# Patient Record
Sex: Male | Born: 2000 | Race: White | Hispanic: No | Marital: Single | Smoking: Never smoker
Health system: Southern US, Community
[De-identification: ages and names within clinical notes are randomized; demographics above are authoritative.]

## PROBLEM LIST (undated history)

## (undated) DIAGNOSIS — J45909 Unspecified asthma, uncomplicated: Secondary | ICD-10-CM

## (undated) DIAGNOSIS — T7840XA Allergy, unspecified, initial encounter: Secondary | ICD-10-CM

## (undated) HISTORY — DX: Unspecified asthma, uncomplicated: J45.909

## (undated) HISTORY — DX: Allergy, unspecified, initial encounter: T78.40XA

## (undated) HISTORY — PX: APPENDECTOMY: SHX54

---
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
## 2020-10-14 NOTE — Progress Notes (Deleted)
     Established patient visit   Patient: Danny Walsh   DOB: [birth year]   19 y.o. Male  MRN: [id number] Visit Date: 10/15/2020  Today's healthcare provider: Margaretann Loveless, PA-C   No chief complaint on file.  Subjective    HPI  ***  {Show patient history (optional):23778::" "}   Medications: Outpatient Medications Prior to Visit  Medication Sig  . escitalopram (LEXAPRO) 10 MG tablet Take 1 tablet (10 mg total) by mouth daily.   No facility-administered medications prior to visit.    Review of Systems  {Heme  Chem  Endocrine  Serology  Results Review (optional):23779::" "}  Objective    There were no vitals taken for this visit. {Show previous vital signs (optional):23777::" "}  Physical Exam  ***  No results found for any visits on 10/15/20.  Assessment & Plan     ***  No follow-ups on file.      {provider attestation***:1}   Reine Just  [facility] [phone] (phone) [phone] (fax)  [facility]

## 2020-10-15 ENCOUNTER — Other Ambulatory Visit: Payer: Self-pay

## 2020-10-15 ENCOUNTER — Ambulatory Visit: Payer: Medicaid Other | Admitting: Physician Assistant

## 2021-06-29 ENCOUNTER — Other Ambulatory Visit: Payer: Self-pay

## 2021-06-29 ENCOUNTER — Emergency Department (HOSPITAL_COMMUNITY): Payer: No Typology Code available for payment source

## 2021-06-29 ENCOUNTER — Inpatient Hospital Stay (HOSPITAL_COMMUNITY)
Admission: EM | Admit: 2021-06-29 | Discharge: 2021-07-02 | DRG: 964 | Disposition: A | Discharge status: Home / Self Care | Payer: No Typology Code available for payment source | Attending: General Surgery | Admitting: General Surgery

## 2021-06-29 ENCOUNTER — Encounter (HOSPITAL_COMMUNITY): Payer: Self-pay | Admitting: *Deleted

## 2021-06-29 DIAGNOSIS — S270XXA Traumatic pneumothorax, initial encounter: Secondary | ICD-10-CM | POA: Diagnosis not present

## 2021-06-29 DIAGNOSIS — Z20822 Contact with and (suspected) exposure to covid-19: Secondary | ICD-10-CM | POA: Diagnosis present

## 2021-06-29 DIAGNOSIS — Y92488 Other paved roadways as the place of occurrence of the external cause: Secondary | ICD-10-CM

## 2021-06-29 DIAGNOSIS — D62 Acute posthemorrhagic anemia: Secondary | ICD-10-CM | POA: Diagnosis present

## 2021-06-29 DIAGNOSIS — R079 Chest pain, unspecified: Secondary | ICD-10-CM | POA: Diagnosis not present

## 2021-06-29 DIAGNOSIS — J45909 Unspecified asthma, uncomplicated: Secondary | ICD-10-CM | POA: Diagnosis present

## 2021-06-29 DIAGNOSIS — S42001A Fracture of unspecified part of right clavicle, initial encounter for closed fracture: Secondary | ICD-10-CM | POA: Diagnosis present

## 2021-06-29 DIAGNOSIS — S36115A Moderate laceration of liver, initial encounter: Secondary | ICD-10-CM | POA: Diagnosis present

## 2021-06-29 DIAGNOSIS — R21 Rash and other nonspecific skin eruption: Secondary | ICD-10-CM | POA: Diagnosis present

## 2021-06-29 DIAGNOSIS — S2241XA Multiple fractures of ribs, right side, initial encounter for closed fracture: Secondary | ICD-10-CM | POA: Diagnosis present

## 2021-06-29 DIAGNOSIS — T1490XA Injury, unspecified, initial encounter: Secondary | ICD-10-CM

## 2021-06-29 DIAGNOSIS — J939 Pneumothorax, unspecified: Secondary | ICD-10-CM

## 2021-06-29 HISTORY — DX: Unspecified asthma, uncomplicated: J45.909

## 2021-06-29 LAB — I-STAT CHEM 8, ED
BUN: 9 mg/dL (ref 6–20)
Calcium, Ion: 1.14 mmol/L — ABNORMAL LOW (ref 1.15–1.40)
Chloride: 102 mmol/L (ref 98–111)
Creatinine, Ser: 1.1 mg/dL (ref 0.61–1.24)
Glucose, Bld: 186 mg/dL — ABNORMAL HIGH (ref 70–99)
HCT: 44 % (ref 39.0–52.0)
Hemoglobin: 15 g/dL (ref 13.0–17.0)
Potassium: 3.1 mmol/L — ABNORMAL LOW (ref 3.5–5.1)
Sodium: 141 mmol/L (ref 135–145)
TCO2: 25 mmol/L (ref 22–32)

## 2021-06-29 LAB — RESP PANEL BY RT-PCR (FLU A&B, COVID) ARPGX2
Influenza A by PCR: NEGATIVE
Influenza B by PCR: NEGATIVE
SARS Coronavirus 2 by RT PCR: NEGATIVE

## 2021-06-29 LAB — SAMPLE TO BLOOD BANK

## 2021-06-29 LAB — CBC
HCT: 43.9 % (ref 39.0–52.0)
Hemoglobin: 14.7 g/dL (ref 13.0–17.0)
MCH: 29.4 pg (ref 26.0–34.0)
MCHC: 33.5 g/dL (ref 30.0–36.0)
MCV: 87.8 fL (ref 80.0–100.0)
Platelets: 279 10*3/uL (ref 150–400)
RBC: 5 MIL/uL (ref 4.22–5.81)
RDW: 12 % (ref 11.5–15.5)
WBC: 15.4 10*3/uL — ABNORMAL HIGH (ref 4.0–10.5)
nRBC: 0 % (ref 0.0–0.2)

## 2021-06-29 LAB — PROTIME-INR
INR: 1.1 (ref 0.8–1.2)
Prothrombin Time: 14.5 seconds (ref 11.4–15.2)

## 2021-06-29 LAB — ETHANOL: Alcohol, Ethyl (B): <10 mg/dL (ref <10)

## 2021-06-29 LAB — LACTIC ACID, PLASMA: Lactic Acid, Venous: 2.8 mmol/L (ref 0.5–1.9)

## 2021-06-29 LAB — COMPREHENSIVE METABOLIC PANEL
ALT: 420 U/L — ABNORMAL HIGH (ref 0–44)
AST: 584 U/L — ABNORMAL HIGH (ref 15–41)
Albumin: 4.7 g/dL (ref 3.5–5.0)
Alkaline Phosphatase: 64 U/L (ref 38–126)
Anion gap: 12 (ref 5–15)
BUN: 10 mg/dL (ref 6–20)
CO2: 23 mmol/L (ref 22–32)
Calcium: 9.8 mg/dL (ref 8.9–10.3)
Chloride: 103 mmol/L (ref 98–111)
Creatinine, Ser: 1.29 mg/dL — ABNORMAL HIGH (ref 0.61–1.24)
GFR, Estimated: >60 mL/min (ref >60)
Glucose, Bld: 185 mg/dL — ABNORMAL HIGH (ref 70–99)
Potassium: 3.1 mmol/L — ABNORMAL LOW (ref 3.5–5.1)
Sodium: 138 mmol/L (ref 135–145)
Total Bilirubin: 0.7 mg/dL (ref 0.3–1.2)
Total Protein: 7.2 g/dL (ref 6.5–8.1)

## 2021-06-29 MED ORDER — FENTANYL CITRATE (PF) 100 MCG/2ML IJ SOLN
INTRAMUSCULAR | Status: AC
  Administered 2021-06-29: 100 ug
  Filled 2021-06-29: qty 2

## 2021-06-29 MED ORDER — HYDROMORPHONE HCL 1 MG/ML IJ SOLN
0.5000 mg | INTRAMUSCULAR | Status: DC | PRN
Start: 2021-06-29 — End: 2021-07-02

## 2021-06-29 MED ORDER — FENTANYL CITRATE PF 50 MCG/ML IJ SOSY
100.0000 ug | PREFILLED_SYRINGE | INTRAMUSCULAR | Status: DC | PRN
Start: 2021-06-29 — End: 2021-06-30
  Administered 2021-06-29 (×2): 100 ug via INTRAVENOUS

## 2021-06-29 MED ORDER — ONDANSETRON 4 MG PO TBDP: 4.0000 mg | ORAL_TABLET | Freq: Four times a day (QID) | ORAL | Status: DC | PRN

## 2021-06-29 MED ORDER — ONDANSETRON HCL 4 MG/2ML IJ SOLN
4.0000 mg | Freq: Four times a day (QID) | INTRAMUSCULAR | Status: DC | PRN
  Administered 2021-06-30: 4 mg via INTRAVENOUS
  Filled 2021-06-29: qty 2

## 2021-06-29 MED ORDER — ENOXAPARIN SODIUM 30 MG/0.3ML IJ SOSY
30.0000 mg | PREFILLED_SYRINGE | Freq: Two times a day (BID) | INTRAMUSCULAR | Status: DC
  Administered 2021-07-01 – 2021-07-02 (×4): 30 mg via SUBCUTANEOUS
  Filled 2021-06-29 (×3): qty 0.3

## 2021-06-29 MED ORDER — ONDANSETRON HCL 4 MG/2ML IJ SOLN
INTRAMUSCULAR | Status: AC
  Administered 2021-06-29: 4 mg
  Filled 2021-06-29: qty 2

## 2021-06-29 MED ORDER — METHOCARBAMOL 1000 MG/10ML IJ SOLN
1000.0000 mg | Freq: Three times a day (TID) | INTRAMUSCULAR | Status: DC
Start: 2021-06-29 — End: 2021-07-01
  Administered 2021-06-29 – 2021-07-01 (×5): 1000 mg via INTRAVENOUS
  Filled 2021-06-29 (×5): qty 10

## 2021-06-29 MED ORDER — OXYCODONE HCL 5 MG PO TABS
10.0000 mg | ORAL_TABLET | ORAL | Status: DC | PRN
Start: 2021-06-29 — End: 2021-07-02
  Administered 2021-06-30 – 2021-07-01 (×2): 10 mg via ORAL
  Filled 2021-06-29 (×3): qty 2

## 2021-06-29 MED ORDER — OXYCODONE HCL 5 MG PO TABS: 5.0000 mg | ORAL_TABLET | ORAL | Status: DC | PRN

## 2021-06-29 MED ORDER — LACTATED RINGERS IV SOLN: INTRAVENOUS | Status: DC

## 2021-06-29 MED ORDER — ACETAMINOPHEN 325 MG PO TABS: 650.0000 mg | ORAL_TABLET | ORAL | Status: DC | PRN

## 2021-06-29 MED ORDER — PROCHLORPERAZINE MALEATE 10 MG PO TABS
10.0000 mg | ORAL_TABLET | Freq: Four times a day (QID) | ORAL | Status: DC | PRN
  Filled 2021-06-29: qty 1

## 2021-06-29 MED ORDER — PROCHLORPERAZINE EDISYLATE 10 MG/2ML IJ SOLN: 5.0000 mg | Freq: Four times a day (QID) | INTRAMUSCULAR | Status: DC | PRN

## 2021-06-29 MED ORDER — SODIUM CHLORIDE 0.9 % IV SOLN
INTRAVENOUS | Status: AC | PRN
  Administered 2021-06-29: 1000 mL via INTRAVENOUS

## 2021-06-29 MED ORDER — IOHEXOL 350 MG/ML SOLN
80.0000 mL | Freq: Once | INTRAVENOUS | Status: AC | PRN
  Administered 2021-06-29: 80 mL via INTRAVENOUS
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
## 2021-06-29 NOTE — ED Notes (Signed)
Contacted lab in processing delay for covid; swab in process now

## 2021-06-29 NOTE — ED Notes (Signed)
Pt placed on 2LPM via  of oxygen for intermittent oxygen desaturation. Currently it was 89% on room air. 2LPM brought up SP02 to 100%

## 2021-06-29 NOTE — ED Notes (Signed)
Transition of Care [facility]) - CAGE-AID Screening  Patient Details  Name: Danny Walsh MRN: [id number] Date of Birth: 2001-04-14   Ranae Plumber, RN 06/29/2021, 10:21 PM   Clinical Narrative:  Pt denies drug or alcohol use  CAGE-AID Screening:    Have You Ever Felt You Ought to Cut Down on Your Drinking or Drug Use?: No Have People Annoyed You By [employer] Your Drinking Or Drug Use?: No Have You Felt Bad Or Guilty About Your Drinking Or Drug Use?: No Have You Ever Had a Drink or Used Drugs First Thing In The Morning to Steady Your Nerves or to Get Rid of a Hangover?: No CAGE-AID Score: 0  Substance Abuse Education Offered: No

## 2021-06-29 NOTE — ED Notes (Signed)
Pt taken to CT with TRN; delay in CT due to chest tube placement
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
## 2021-06-29 NOTE — ED Notes (Signed)
As per Dr. [name], pt can have clear liquids and EDP was going to page ortho.
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
## 2021-06-29 NOTE — ED Notes (Signed)
Emesis x2; zofran provided

## 2021-06-29 NOTE — ED Notes (Signed)
MD notified that pt was placed on 2LPM via [place] of oxygen for his desaturation episodes.

## 2021-06-29 NOTE — ED Notes (Signed)
MD at bedside for R sided chest tube
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
## 2021-06-30 ENCOUNTER — Inpatient Hospital Stay (HOSPITAL_COMMUNITY): Payer: No Typology Code available for payment source

## 2021-06-30 LAB — CBC
HCT: 37.6 % — ABNORMAL LOW (ref 39.0–52.0)
HCT: 36.4 % — ABNORMAL LOW (ref 39.0–52.0)
Hemoglobin: 12.7 g/dL — ABNORMAL LOW (ref 13.0–17.0)
Hemoglobin: 12.3 g/dL — ABNORMAL LOW (ref 13.0–17.0)
MCH: 29.5 pg (ref 26.0–34.0)
MCH: 29.4 pg (ref 26.0–34.0)
MCHC: 33.8 g/dL (ref 30.0–36.0)
MCHC: 33.8 g/dL (ref 30.0–36.0)
MCV: 87.2 fL (ref 80.0–100.0)
MCV: 87.1 fL (ref 80.0–100.0)
Platelets: 212 10*3/uL (ref 150–400)
Platelets: 205 10*3/uL (ref 150–400)
RBC: 4.31 MIL/uL (ref 4.22–5.81)
RBC: 4.18 MIL/uL — ABNORMAL LOW (ref 4.22–5.81)
RDW: 12 % (ref 11.5–15.5)
RDW: 12.3 % (ref 11.5–15.5)
WBC: 9.9 10*3/uL (ref 4.0–10.5)
WBC: 9.7 10*3/uL (ref 4.0–10.5)
nRBC: 0 % (ref 0.0–0.2)
nRBC: 0 % (ref 0.0–0.2)

## 2021-06-30 LAB — BASIC METABOLIC PANEL
Anion gap: 8 (ref 5–15)
BUN: 9 mg/dL (ref 6–20)
CO2: 23 mmol/L (ref 22–32)
Calcium: 8.8 mg/dL — ABNORMAL LOW (ref 8.9–10.3)
Chloride: 105 mmol/L (ref 98–111)
Creatinine, Ser: 1.08 mg/dL (ref 0.61–1.24)
GFR, Estimated: >60 mL/min (ref >60)
Glucose, Bld: 113 mg/dL — ABNORMAL HIGH (ref 70–99)
Potassium: 4.2 mmol/L (ref 3.5–5.1)
Sodium: 136 mmol/L (ref 135–145)

## 2021-06-30 LAB — HIV ANTIBODY (ROUTINE TESTING W REFLEX): HIV Screen 4th Generation wRfx: NONREACTIVE

## 2021-06-30 MED ORDER — FENTANYL CITRATE (PF) 100 MCG/2ML IJ SOLN
100.0000 ug | INTRAMUSCULAR | Status: DC | PRN
Start: 2021-06-30 — End: 2021-07-01

## 2021-06-30 NOTE — ED Notes (Signed)
Floor nurse is not taking report at this time

## 2021-06-30 NOTE — ED Notes (Signed)
Report given to [facility] RN at [phone]

## 2021-06-30 NOTE — ED Notes (Signed)
Attempted to call report at [phone], nurse states she needs 5 more minutes, will attempt again

## 2021-06-30 NOTE — ED Notes (Signed)
[place] Grandchild father [phone] would like to speak to the patient or get an update from the nurse
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
## 2021-06-30 NOTE — ED Notes (Signed)
Incentive spirometer given to patient and instructed on use. Patient verbalized understanding on use without issue.
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
## 2021-06-30 NOTE — Progress Notes (Signed)
Orthopedic Tech Progress Note Patient Details:  Danny Walsh 12-26-2000 [id number]  Ortho Devices Type of Ortho Device: Arm sling Ortho Device/Splint Location: rue Ortho Device/Splint Interventions: Ordered, Application, Adjustment   Post Interventions Patient Tolerated: Well Instructions Provided: Care of device, Adjustment of device  [name] 06/30/2021, 2:31 AM
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
## 2021-07-01 ENCOUNTER — Inpatient Hospital Stay (HOSPITAL_COMMUNITY): Payer: No Typology Code available for payment source

## 2021-07-01 LAB — CBC
HCT: 37.8 % — ABNORMAL LOW (ref 39.0–52.0)
Hemoglobin: 12.5 g/dL — ABNORMAL LOW (ref 13.0–17.0)
MCH: 29 pg (ref 26.0–34.0)
MCHC: 33.1 g/dL (ref 30.0–36.0)
MCV: 87.7 fL (ref 80.0–100.0)
Platelets: 184 10*3/uL (ref 150–400)
RBC: 4.31 MIL/uL (ref 4.22–5.81)
RDW: 12.2 % (ref 11.5–15.5)
WBC: 9.3 10*3/uL (ref 4.0–10.5)
nRBC: 0 % (ref 0.0–0.2)

## 2021-07-01 LAB — BASIC METABOLIC PANEL
Anion gap: 7 (ref 5–15)
BUN: 8 mg/dL (ref 6–20)
CO2: 27 mmol/L (ref 22–32)
Calcium: 9.2 mg/dL (ref 8.9–10.3)
Chloride: 102 mmol/L (ref 98–111)
Creatinine, Ser: 1.16 mg/dL (ref 0.61–1.24)
GFR, Estimated: >60 mL/min (ref >60)
Glucose, Bld: 96 mg/dL (ref 70–99)
Potassium: 4 mmol/L (ref 3.5–5.1)
Sodium: 136 mmol/L (ref 135–145)

## 2021-07-01 MED ORDER — DIPHENHYDRAMINE-ZINC ACETATE 2-0.1 % EX CREA
TOPICAL_CREAM | Freq: Three times a day (TID) | CUTANEOUS | Status: DC | PRN
  Filled 2021-07-01: qty 28

## 2021-07-01 MED ORDER — ACETAMINOPHEN 325 MG PO TABS: 650.0000 mg | ORAL_TABLET | Freq: Four times a day (QID) | ORAL | Status: DC

## 2021-07-01 MED ORDER — METHOCARBAMOL 500 MG PO TABS
1000.0000 mg | ORAL_TABLET | Freq: Three times a day (TID) | ORAL | Status: DC
  Administered 2021-07-01 – 2021-07-02 (×4): 1000 mg via ORAL
  Filled 2021-07-01 (×4): qty 2

## 2021-07-01 MED ORDER — ACETAMINOPHEN 500 MG PO TABS: 1000.0000 mg | ORAL_TABLET | Freq: Four times a day (QID) | ORAL | Status: DC

## 2021-07-01 MED ORDER — ACETAMINOPHEN 325 MG PO TABS
650.0000 mg | ORAL_TABLET | Freq: Four times a day (QID) | ORAL | Status: DC
  Administered 2021-07-01 – 2021-07-02 (×6): 650 mg via ORAL
  Filled 2021-07-01 (×5): qty 2

## 2021-07-01 NOTE — Progress Notes (Signed)
Patient arrived to 6N20 from ED. Report received from Ethelene Browns, [place]. Patient alert and oriented x4. R chest tube in place. 65ml of sanguinous output. Dressing clean, dry, and intact. Pt c/o 1/10 R flank pain due to activity. R arm in sling. See assessment. Pt c/o 0/10 shoulder pain at this moment. Pts brother and girlfriend at bedside.   No chest tube orders. Will page MD for chest tube orders due.  [id number] Adu, MD gave verbal order for -20cm continuous chest tube wall suction.

## 2021-07-01 NOTE — Progress Notes (Signed)
Pt has two new spots that are itchy on his right thigh. One is just above the inside of his knee, raised bump. The other is six little raised dots in a small rectangle about 2 inches below his groin. He is trying to refrain from itching and is afraid of them possibly spreading. Notified PA, she will come up to see him shortly.

## 2021-07-01 NOTE — Progress Notes (Signed)
Progress Note     Subjective: Patient reports some mild pain but overall well controlled. Denies SOB. Using IS and able to pull to 1000. No further n/v. Passing flatus.   Objective: Vital signs in last 24 hours: Temp:  [98.1 F (36.7 C)-99.2 F (37.3 C)] 99.2 F (37.3 C) (08/24 0736) Pulse Rate:  [58-86] 74 (08/24 0736) Resp:  [14-22] 18 (08/24 0736) BP: (100-126)/(54-96) 114/67 (08/24 0736) SpO2:  [95 %-98 %] 95 % (08/24 0736)    Intake/Output from previous day: 08/23 0701 - 08/24 0700 In: 1785.8 [P.O.:240; I.V.:1193.3; IV Piggyback:352.5] Out: 692 [Urine:600; Chest Tube:92] Intake/Output this shift: No intake/output data recorded.  PE: General: pleasant, WD, thin male who is laying in bed in NAD HEENT: head is normocephalic, atraumatic.  Sclera are noninjected.  PERRL.  Ears and nose without any masses or lesions.  Mouth is pink and moist Heart: regular, rate, and rhythm.  Normal s1,s2. No obvious murmurs, gallops, or rubs noted.  Palpable radial and pedal pulses bilaterally Lungs: CTAB, no wheezes, rhonchi, or rales noted.  Respiratory effort nonlabored; R CT in place with bloody drainage (110 cc out total), no air leak Abd: soft, NT, ND, +BS, no masses, hernias, or organomegaly MS: RUE in sling, R hand NVI; no deformity of LUE and ROM appears grossly intact; No deformity BLE and ROM appears grossly intact  Skin: warm and dry with no masses, lesions, or rashes Neuro: Cranial nerves 2-12 grossly intact, sensation is normal throughout Psych: A&Ox3 with an appropriate affect.    Lab Results:  Recent Labs    06/30/21 1152 07/01/21 0757  WBC 9.7 9.3  HGB 12.3* 12.5*  HCT 36.4* 37.8*  PLT 205 184   BMET Recent Labs    06/30/21 0550 07/01/21 0130  NA 136 136  K 4.2 4.0  CL 105 102  CO2 23 27  GLUCOSE 113* 96  BUN 9 8  CREATININE 1.08 1.16  CALCIUM 8.8* 9.2   PT/INR Recent Labs    06/29/21 2035  LABPROT 14.5  INR 1.1   CMP     Component Value  Date/Time   NA 136 07/01/2021 0130   K 4.0 07/01/2021 0130   CL 102 07/01/2021 0130   CO2 27 07/01/2021 0130   GLUCOSE 96 07/01/2021 0130   BUN 8 07/01/2021 0130   CREATININE 1.16 07/01/2021 0130   CALCIUM 9.2 07/01/2021 0130   PROT 7.2 06/29/2021 2035   ALBUMIN 4.7 06/29/2021 2035   AST 584 (H) 06/29/2021 2035   ALT 420 (H) 06/29/2021 2035   ALKPHOS 64 06/29/2021 2035   BILITOT 0.7 06/29/2021 2035   GFRNONAA >60 07/01/2021 0130   Lipase  No results found for: LIPASE     Studies/Results: DG Shoulder Right  Result Date: 06/29/2021 CLINICAL DATA:  Trauma and pain. EXAM: RIGHT SHOULDER - 2+ VIEW COMPARISON:  Chest CT dated 06/29/2021. FINDINGS: Interval placement of a right-sided chest tube with tip in the right suprahilar region. No pneumothorax identified. Nondisplaced fracture of the right clavicle. IMPRESSION: Interval placement of a right-sided chest tube. No pneumothorax identified. Electronically Signed   By: Elgie CollardArash  Radparvar M.D.   On: 06/29/2021 23:13   CT HEAD WO CONTRAST  Result Date: 06/29/2021 CLINICAL DATA:  Polytrauma, critical, head/C-spine injury suspected; Chest trauma, mod-severe; Abdominal trauma. EXAM: CT HEAD WITHOUT CONTRAST CT CERVICAL SPINE WITHOUT CONTRAST CT CHEST, ABDOMEN AND PELVIS WITH CONTRAST TECHNIQUE: Contiguous axial images were obtained from the base of the skull through the vertex without  intravenous contrast. Multidetector CT imaging of the cervical spine was performed without intravenous contrast. Multiplanar CT image reconstructions were also generated. Multidetector CT imaging of the chest, abdomen and pelvis was performed following the standard protocol during bolus administration of intravenous contrast. CONTRAST:  80mL OMNIPAQUE IOHEXOL 350 MG/ML SOLN COMPARISON:  None. FINDINGS: CT HEAD FINDINGS Brain: Normal anatomic configuration. No abnormal intra or extra-axial mass lesion or fluid collection. No abnormal mass effect or midline shift. No  evidence of acute intracranial hemorrhage or infarct. Ventricular size is normal. Cerebellum unremarkable. Vascular: Unremarkable Skull: Intact Sinuses/Orbits: Paranasal sinuses are clear. Orbits are unremarkable. Other: Mastoid air cells and middle ear cavities are clear. CT CERVICAL FINDINGS Alignment: Normal. Skull base and vertebrae: No acute fracture. No primary bone lesion or focal pathologic process. Soft tissues and spinal canal: No prevertebral fluid or swelling. No visible canal hematoma. Disc levels: The intervertebral disc height and vertebral body heights have been preserved. Sagittal reformats demonstrate no prevertebral soft tissue thickening. Review of the axial images demonstrates no significant uncovertebral or facet arthrosis. No significant canal stenosis or neuroforaminal narrowing noted. Other:  None CT CHEST FINDINGS Cardiovascular: No significant vascular findings. Normal heart size. No pericardial effusion. Mediastinum/Nodes: No enlarged mediastinal, hilar, or axillary lymph nodes. Thyroid gland, trachea, and esophagus demonstrate no significant findings. No mediastinal hematoma. No pneumomediastinum. Lungs/Pleura: Small right pneumothorax is present. Right anterolateral chest tube courses within the fissure terminating within the medial pleural space at the level of the right mainstem bronchus. No hyperexpansion of the right hemithorax or mediastinal shift to suggest tension physiology. There is minimal scattered at filtrate within the a basilar right middle and lower lobe which may represent multifocal contusion in this acutely traumatized patient. Left lung is clear. No pneumothorax on the left. No pleural effusion. Musculoskeletal: There is an acute minimally displaced fracture of the mid-diaphysis of the right clavicle. There are acute fractures of the right fourth rib and possibly the right third rib anteriorly just lateral to the a costochondral junction. Small subcutaneous gas noted  within the right chest wall adjacent to the right chest tube. CT ABDOMEN PELVIS FINDINGS Hepatobiliary: There is a linear, slightly irregular transverse intraparenchymal hematoma/laceration within segment 3 and 4 of the liver measuring roughly 5-6 cm in length, best seen on coronal image # 33. This is in close proximity to the a middle hepatic vein and left portal vein, but these vessels appear intact. No active extravasation. This is compatible with a grade 2 AAST liver injury. No perihepatic hematoma. No intra or extrahepatic biliary ductal dilation. Gallbladder unremarkable. Pancreas: Unremarkable Spleen: Unremarkable Adrenals/Urinary Tract: Adrenal glands are unremarkable. Kidneys are normal, without renal calculi, focal lesion, or hydronephrosis. Bladder is unremarkable. Stomach/Bowel: The stomach, small bowel, and large bowel are unremarkable. No free intraperitoneal gas or fluid. Vascular/Lymphatic: No significant vascular findings are present. No enlarged abdominal or pelvic lymph nodes. Reproductive: Prostate is unremarkable. Other: No abdominal wall hernia. Musculoskeletal: No acute bone abnormality within the abdomen and pelvis. IMPRESSION: No acute intracranial injury.  No calvarial fracture. No acute fracture or listhesis of the cervical spine. Small right pneumothorax. Right chest tube is within the right pulmonary fissure, however, there is no evidence of tension physiology. Minimally displaced mid-diaphyseal fracture of the right clavicle. Acute mildly angulated fractures of the right third and fourth ribs anteriorly. AAST grade II liver injury. Adjacent central hepatic vasculature appears intact. No active extravasation. No perihepatic hematoma or free intraperitoneal fluid. These results were called by telephone at  the time of interpretation on 06/29/2021 at 10:38 pm to provider Gerhard Munch , who verbally acknowledged these results. Electronically Signed   By: Helyn Numbers M.D.   On:  06/29/2021 22:38   CT CHEST W CONTRAST  Result Date: 06/29/2021 CLINICAL DATA:  Polytrauma, critical, head/C-spine injury suspected; Chest trauma, mod-severe; Abdominal trauma. EXAM: CT HEAD WITHOUT CONTRAST CT CERVICAL SPINE WITHOUT CONTRAST CT CHEST, ABDOMEN AND PELVIS WITH CONTRAST TECHNIQUE: Contiguous axial images were obtained from the base of the skull through the vertex without intravenous contrast. Multidetector CT imaging of the cervical spine was performed without intravenous contrast. Multiplanar CT image reconstructions were also generated. Multidetector CT imaging of the chest, abdomen and pelvis was performed following the standard protocol during bolus administration of intravenous contrast. CONTRAST:  79mL OMNIPAQUE IOHEXOL 350 MG/ML SOLN COMPARISON:  None. FINDINGS: CT HEAD FINDINGS Brain: Normal anatomic configuration. No abnormal intra or extra-axial mass lesion or fluid collection. No abnormal mass effect or midline shift. No evidence of acute intracranial hemorrhage or infarct. Ventricular size is normal. Cerebellum unremarkable. Vascular: Unremarkable Skull: Intact Sinuses/Orbits: Paranasal sinuses are clear. Orbits are unremarkable. Other: Mastoid air cells and middle ear cavities are clear. CT CERVICAL FINDINGS Alignment: Normal. Skull base and vertebrae: No acute fracture. No primary bone lesion or focal pathologic process. Soft tissues and spinal canal: No prevertebral fluid or swelling. No visible canal hematoma. Disc levels: The intervertebral disc height and vertebral body heights have been preserved. Sagittal reformats demonstrate no prevertebral soft tissue thickening. Review of the axial images demonstrates no significant uncovertebral or facet arthrosis. No significant canal stenosis or neuroforaminal narrowing noted. Other:  None CT CHEST FINDINGS Cardiovascular: No significant vascular findings. Normal heart size. No pericardial effusion. Mediastinum/Nodes: No enlarged  mediastinal, hilar, or axillary lymph nodes. Thyroid gland, trachea, and esophagus demonstrate no significant findings. No mediastinal hematoma. No pneumomediastinum. Lungs/Pleura: Small right pneumothorax is present. Right anterolateral chest tube courses within the fissure terminating within the medial pleural space at the level of the right mainstem bronchus. No hyperexpansion of the right hemithorax or mediastinal shift to suggest tension physiology. There is minimal scattered at filtrate within the a basilar right middle and lower lobe which may represent multifocal contusion in this acutely traumatized patient. Left lung is clear. No pneumothorax on the left. No pleural effusion. Musculoskeletal: There is an acute minimally displaced fracture of the mid-diaphysis of the right clavicle. There are acute fractures of the right fourth rib and possibly the right third rib anteriorly just lateral to the a costochondral junction. Small subcutaneous gas noted within the right chest wall adjacent to the right chest tube. CT ABDOMEN PELVIS FINDINGS Hepatobiliary: There is a linear, slightly irregular transverse intraparenchymal hematoma/laceration within segment 3 and 4 of the liver measuring roughly 5-6 cm in length, best seen on coronal image # 33. This is in close proximity to the a middle hepatic vein and left portal vein, but these vessels appear intact. No active extravasation. This is compatible with a grade 2 AAST liver injury. No perihepatic hematoma. No intra or extrahepatic biliary ductal dilation. Gallbladder unremarkable. Pancreas: Unremarkable Spleen: Unremarkable Adrenals/Urinary Tract: Adrenal glands are unremarkable. Kidneys are normal, without renal calculi, focal lesion, or hydronephrosis. Bladder is unremarkable. Stomach/Bowel: The stomach, small bowel, and large bowel are unremarkable. No free intraperitoneal gas or fluid. Vascular/Lymphatic: No significant vascular findings are present. No enlarged  abdominal or pelvic lymph nodes. Reproductive: Prostate is unremarkable. Other: No abdominal wall hernia. Musculoskeletal: No acute bone abnormality within  the abdomen and pelvis. IMPRESSION: No acute intracranial injury.  No calvarial fracture. No acute fracture or listhesis of the cervical spine. Small right pneumothorax. Right chest tube is within the right pulmonary fissure, however, there is no evidence of tension physiology. Minimally displaced mid-diaphyseal fracture of the right clavicle. Acute mildly angulated fractures of the right third and fourth ribs anteriorly. AAST grade II liver injury. Adjacent central hepatic vasculature appears intact. No active extravasation. No perihepatic hematoma or free intraperitoneal fluid. These results were called by telephone at the time of interpretation on 06/29/2021 at 10:38 pm to provider Gerhard Munch , who verbally acknowledged these results. Electronically Signed   By: Helyn Numbers M.D.   On: 06/29/2021 22:38   CT CERVICAL SPINE WO CONTRAST  Result Date: 06/29/2021 CLINICAL DATA:  Polytrauma, critical, head/C-spine injury suspected; Chest trauma, mod-severe; Abdominal trauma. EXAM: CT HEAD WITHOUT CONTRAST CT CERVICAL SPINE WITHOUT CONTRAST CT CHEST, ABDOMEN AND PELVIS WITH CONTRAST TECHNIQUE: Contiguous axial images were obtained from the base of the skull through the vertex without intravenous contrast. Multidetector CT imaging of the cervical spine was performed without intravenous contrast. Multiplanar CT image reconstructions were also generated. Multidetector CT imaging of the chest, abdomen and pelvis was performed following the standard protocol during bolus administration of intravenous contrast. CONTRAST:  80mL OMNIPAQUE IOHEXOL 350 MG/ML SOLN COMPARISON:  None. FINDINGS: CT HEAD FINDINGS Brain: Normal anatomic configuration. No abnormal intra or extra-axial mass lesion or fluid collection. No abnormal mass effect or midline shift. No evidence of  acute intracranial hemorrhage or infarct. Ventricular size is normal. Cerebellum unremarkable. Vascular: Unremarkable Skull: Intact Sinuses/Orbits: Paranasal sinuses are clear. Orbits are unremarkable. Other: Mastoid air cells and middle ear cavities are clear. CT CERVICAL FINDINGS Alignment: Normal. Skull base and vertebrae: No acute fracture. No primary bone lesion or focal pathologic process. Soft tissues and spinal canal: No prevertebral fluid or swelling. No visible canal hematoma. Disc levels: The intervertebral disc height and vertebral body heights have been preserved. Sagittal reformats demonstrate no prevertebral soft tissue thickening. Review of the axial images demonstrates no significant uncovertebral or facet arthrosis. No significant canal stenosis or neuroforaminal narrowing noted. Other:  None CT CHEST FINDINGS Cardiovascular: No significant vascular findings. Normal heart size. No pericardial effusion. Mediastinum/Nodes: No enlarged mediastinal, hilar, or axillary lymph nodes. Thyroid gland, trachea, and esophagus demonstrate no significant findings. No mediastinal hematoma. No pneumomediastinum. Lungs/Pleura: Small right pneumothorax is present. Right anterolateral chest tube courses within the fissure terminating within the medial pleural space at the level of the right mainstem bronchus. No hyperexpansion of the right hemithorax or mediastinal shift to suggest tension physiology. There is minimal scattered at filtrate within the a basilar right middle and lower lobe which may represent multifocal contusion in this acutely traumatized patient. Left lung is clear. No pneumothorax on the left. No pleural effusion. Musculoskeletal: There is an acute minimally displaced fracture of the mid-diaphysis of the right clavicle. There are acute fractures of the right fourth rib and possibly the right third rib anteriorly just lateral to the a costochondral junction. Small subcutaneous gas noted within the  right chest wall adjacent to the right chest tube. CT ABDOMEN PELVIS FINDINGS Hepatobiliary: There is a linear, slightly irregular transverse intraparenchymal hematoma/laceration within segment 3 and 4 of the liver measuring roughly 5-6 cm in length, best seen on coronal image # 33. This is in close proximity to the a middle hepatic vein and left portal vein, but these vessels appear intact. No active extravasation.  This is compatible with a grade 2 AAST liver injury. No perihepatic hematoma. No intra or extrahepatic biliary ductal dilation. Gallbladder unremarkable. Pancreas: Unremarkable Spleen: Unremarkable Adrenals/Urinary Tract: Adrenal glands are unremarkable. Kidneys are normal, without renal calculi, focal lesion, or hydronephrosis. Bladder is unremarkable. Stomach/Bowel: The stomach, small bowel, and large bowel are unremarkable. No free intraperitoneal gas or fluid. Vascular/Lymphatic: No significant vascular findings are present. No enlarged abdominal or pelvic lymph nodes. Reproductive: Prostate is unremarkable. Other: No abdominal wall hernia. Musculoskeletal: No acute bone abnormality within the abdomen and pelvis. IMPRESSION: No acute intracranial injury.  No calvarial fracture. No acute fracture or listhesis of the cervical spine. Small right pneumothorax. Right chest tube is within the right pulmonary fissure, however, there is no evidence of tension physiology. Minimally displaced mid-diaphyseal fracture of the right clavicle. Acute mildly angulated fractures of the right third and fourth ribs anteriorly. AAST grade II liver injury. Adjacent central hepatic vasculature appears intact. No active extravasation. No perihepatic hematoma or free intraperitoneal fluid. These results were called by telephone at the time of interpretation on 06/29/2021 at 10:38 pm to provider Gerhard Munch , who verbally acknowledged these results. Electronically Signed   By: Helyn Numbers M.D.   On: 06/29/2021 22:38    CT ABDOMEN PELVIS W CONTRAST  Result Date: 06/29/2021 CLINICAL DATA:  Polytrauma, critical, head/C-spine injury suspected; Chest trauma, mod-severe; Abdominal trauma. EXAM: CT HEAD WITHOUT CONTRAST CT CERVICAL SPINE WITHOUT CONTRAST CT CHEST, ABDOMEN AND PELVIS WITH CONTRAST TECHNIQUE: Contiguous axial images were obtained from the base of the skull through the vertex without intravenous contrast. Multidetector CT imaging of the cervical spine was performed without intravenous contrast. Multiplanar CT image reconstructions were also generated. Multidetector CT imaging of the chest, abdomen and pelvis was performed following the standard protocol during bolus administration of intravenous contrast. CONTRAST:  30mL OMNIPAQUE IOHEXOL 350 MG/ML SOLN COMPARISON:  None. FINDINGS: CT HEAD FINDINGS Brain: Normal anatomic configuration. No abnormal intra or extra-axial mass lesion or fluid collection. No abnormal mass effect or midline shift. No evidence of acute intracranial hemorrhage or infarct. Ventricular size is normal. Cerebellum unremarkable. Vascular: Unremarkable Skull: Intact Sinuses/Orbits: Paranasal sinuses are clear. Orbits are unremarkable. Other: Mastoid air cells and middle ear cavities are clear. CT CERVICAL FINDINGS Alignment: Normal. Skull base and vertebrae: No acute fracture. No primary bone lesion or focal pathologic process. Soft tissues and spinal canal: No prevertebral fluid or swelling. No visible canal hematoma. Disc levels: The intervertebral disc height and vertebral body heights have been preserved. Sagittal reformats demonstrate no prevertebral soft tissue thickening. Review of the axial images demonstrates no significant uncovertebral or facet arthrosis. No significant canal stenosis or neuroforaminal narrowing noted. Other:  None CT CHEST FINDINGS Cardiovascular: No significant vascular findings. Normal heart size. No pericardial effusion. Mediastinum/Nodes: No enlarged mediastinal,  hilar, or axillary lymph nodes. Thyroid gland, trachea, and esophagus demonstrate no significant findings. No mediastinal hematoma. No pneumomediastinum. Lungs/Pleura: Small right pneumothorax is present. Right anterolateral chest tube courses within the fissure terminating within the medial pleural space at the level of the right mainstem bronchus. No hyperexpansion of the right hemithorax or mediastinal shift to suggest tension physiology. There is minimal scattered at filtrate within the a basilar right middle and lower lobe which may represent multifocal contusion in this acutely traumatized patient. Left lung is clear. No pneumothorax on the left. No pleural effusion. Musculoskeletal: There is an acute minimally displaced fracture of the mid-diaphysis of the right clavicle. There are acute fractures of the  right fourth rib and possibly the right third rib anteriorly just lateral to the a costochondral junction. Small subcutaneous gas noted within the right chest wall adjacent to the right chest tube. CT ABDOMEN PELVIS FINDINGS Hepatobiliary: There is a linear, slightly irregular transverse intraparenchymal hematoma/laceration within segment 3 and 4 of the liver measuring roughly 5-6 cm in length, best seen on coronal image # 33. This is in close proximity to the a middle hepatic vein and left portal vein, but these vessels appear intact. No active extravasation. This is compatible with a grade 2 AAST liver injury. No perihepatic hematoma. No intra or extrahepatic biliary ductal dilation. Gallbladder unremarkable. Pancreas: Unremarkable Spleen: Unremarkable Adrenals/Urinary Tract: Adrenal glands are unremarkable. Kidneys are normal, without renal calculi, focal lesion, or hydronephrosis. Bladder is unremarkable. Stomach/Bowel: The stomach, small bowel, and large bowel are unremarkable. No free intraperitoneal gas or fluid. Vascular/Lymphatic: No significant vascular findings are present. No enlarged abdominal or  pelvic lymph nodes. Reproductive: Prostate is unremarkable. Other: No abdominal wall hernia. Musculoskeletal: No acute bone abnormality within the abdomen and pelvis. IMPRESSION: No acute intracranial injury.  No calvarial fracture. No acute fracture or listhesis of the cervical spine. Small right pneumothorax. Right chest tube is within the right pulmonary fissure, however, there is no evidence of tension physiology. Minimally displaced mid-diaphyseal fracture of the right clavicle. Acute mildly angulated fractures of the right third and fourth ribs anteriorly. AAST grade II liver injury. Adjacent central hepatic vasculature appears intact. No active extravasation. No perihepatic hematoma or free intraperitoneal fluid. These results were called by telephone at the time of interpretation on 06/29/2021 at 10:38 pm to provider Gerhard Munch , who verbally acknowledged these results. Electronically Signed   By: Helyn Numbers M.D.   On: 06/29/2021 22:38   DG Pelvis Portable  Result Date: 06/29/2021 CLINICAL DATA:  Trauma EXAM: PORTABLE PELVIS 1-2 VIEWS COMPARISON:  None. FINDINGS: There is no evidence of pelvic fracture or diastasis. No pelvic bone lesions are seen. IMPRESSION: Negative. Electronically Signed   By: Burman Nieves M.D.   On: 06/29/2021 20:44   DG CHEST PORT 1 VIEW  Result Date: 07/01/2021 CLINICAL DATA:  Pneumothorax, RIGHT chest tube EXAM: PORTABLE CHEST 1 VIEW COMPARISON:  Portable exam 0544 hours compared to 06/30/2021 FINDINGS: RIGHT thoracostomy tube unchanged. Normal heart size, mediastinal contours, and pulmonary vascularity. Trace residual RIGHT apical pneumothorax. Lungs otherwise clear. No infiltrate or pleural effusion identified. RIGHT clavicular fracture, nondisplaced. Compression fracture lateral RIGHT fourth rib. IMPRESSION: Trace residual RIGHT apical pneumothorax. Electronically Signed   By: Ulyses Southward M.D.   On: 07/01/2021 08:34   DG Chest Port 1 View  Result Date:  06/30/2021 CLINICAL DATA:  Right chest tube in place, pneumothorax EXAM: PORTABLE CHEST 1 VIEW COMPARISON:  Chest radiograph 1 day prior FINDINGS: The cardiomediastinal silhouette is stable. A right chest tube is in stable position. A small residual apical pneumothorax is seen with the pleural line projecting between the posterior second and third ribs. The lungs are otherwise clear, with no focal consolidation. There is no significant pleural effusion. There is known left pneumothorax. The bones are stable, including the minimally displaced right clavicle fracture. IMPRESSION: Stable right chest tube with small residual apical pneumothorax as above. Electronically Signed   By: Lesia Hausen M.D.

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
## 2021-07-01 NOTE — Progress Notes (Signed)
PT Cancellation Note  Patient Details Name: Danny Walsh MRN: [id number] DOB: January 16, 2001   Cancelled Treatment:    Reason Eval/Treat Not Completed: PT screened, no needs identified, will sign off. PT attempts to see patient for PT session, upon arrival to unit pt is observed ambulating around the unit independently, managing chest tube. Pt denies concerns for mobility at this time and expresses no need for stair training currently. Acute PT signing off. No further PT needs identified.   Arlyss Gandy 07/01/2021, 5:32 PM

## 2021-07-01 NOTE — Progress Notes (Addendum)
Pt ambulated one lap around the unit and down the hallway outside the unit without assistance. Pt encouraged to keep walking tonight.  No pain or shortness of breath. Pt able to carry on conversation without difficulty while walking.

## 2021-07-02 ENCOUNTER — Inpatient Hospital Stay (HOSPITAL_COMMUNITY): Payer: No Typology Code available for payment source

## 2021-07-02 LAB — BASIC METABOLIC PANEL
Anion gap: 8 (ref 5–15)
BUN: 11 mg/dL (ref 6–20)
CO2: 26 mmol/L (ref 22–32)
Calcium: 9.1 mg/dL (ref 8.9–10.3)
Chloride: 103 mmol/L (ref 98–111)
Creatinine, Ser: 1.03 mg/dL (ref 0.61–1.24)
GFR, Estimated: >60 mL/min (ref >60)
Glucose, Bld: 99 mg/dL (ref 70–99)
Potassium: 3.8 mmol/L (ref 3.5–5.1)
Sodium: 137 mmol/L (ref 135–145)

## 2021-07-02 LAB — CBC
HCT: 40 % (ref 39.0–52.0)
Hemoglobin: 13.6 g/dL (ref 13.0–17.0)
MCH: 29.2 pg (ref 26.0–34.0)
MCHC: 34 g/dL (ref 30.0–36.0)
MCV: 86 fL (ref 80.0–100.0)
Platelets: 197 10*3/uL (ref 150–400)
RBC: 4.65 MIL/uL (ref 4.22–5.81)
RDW: 11.9 % (ref 11.5–15.5)
WBC: 6.5 10*3/uL (ref 4.0–10.5)
nRBC: 0 % (ref 0.0–0.2)

## 2021-07-02 MED ORDER — METHOCARBAMOL 500 MG PO TABS: 1000.0000 mg | ORAL_TABLET | Freq: Three times a day (TID) | ORAL | 0 refills | Status: AC

## 2021-07-02 MED ORDER — DIPHENHYDRAMINE-ZINC ACETATE 2-0.1 % EX CREA: TOPICAL_CREAM | Freq: Three times a day (TID) | CUTANEOUS | 0 refills | Status: AC | PRN

## 2021-07-02 MED ORDER — DIPHENHYDRAMINE HCL 25 MG PO CAPS
25.0000 mg | ORAL_CAPSULE | Freq: Four times a day (QID) | ORAL | Status: DC | PRN
Start: 2021-07-02 — End: 2021-07-02

## 2021-07-02 MED ORDER — ACETAMINOPHEN 325 MG PO TABS: 650.0000 mg | ORAL_TABLET | Freq: Four times a day (QID) | ORAL | Status: AC | PRN

## 2021-07-02 MED ORDER — IBUPROFEN 200 MG PO TABS: 600.0000 mg | ORAL_TABLET | Freq: Four times a day (QID) | ORAL | 2 refills | Status: AC | PRN
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
## 2021-07-02 NOTE — Progress Notes (Signed)
Danny Walsh to be D/C'd  per MD order.  Discussed with the patient and all questions fully answered.  VSS, Skin clean, dry and intact without evidence of skin break down, no evidence of skin tears noted.  IV catheter discontinued intact. Site without signs and symptoms of complications. Dressing and pressure applied.  An After Visit Summary was printed and given to the patient. Patient received prescription.  D/c education completed with patient/family including follow up instructions, medication list, d/c activities limitations if indicated, with other d/c instructions as indicated by MD - patient able to verbalize understanding, all questions fully answered.   Patient instructed to return to ED, call 911, or call MD for any changes in condition.   Patient walked out on own, and D/C home via private auto.

## 2021-07-03 ENCOUNTER — Encounter: Payer: Self-pay | Admitting: Licensed Clinical Social Worker
